# Patient Record
Sex: Female | Born: 1950 | Race: Black or African American | Hispanic: No | Marital: Single | Smoking: Current every day smoker
Health system: Southern US, Community
[De-identification: ages and names within clinical notes are randomized; demographics above are authoritative.]

## PROBLEM LIST (undated history)

## (undated) DIAGNOSIS — F329 Major depressive disorder, single episode, unspecified: Secondary | ICD-10-CM

## (undated) DIAGNOSIS — Z720 Tobacco use: Secondary | ICD-10-CM

## (undated) DIAGNOSIS — I2510 Atherosclerotic heart disease of native coronary artery without angina pectoris: Secondary | ICD-10-CM

## (undated) DIAGNOSIS — I259 Chronic ischemic heart disease, unspecified: Secondary | ICD-10-CM

## (undated) DIAGNOSIS — E785 Hyperlipidemia, unspecified: Secondary | ICD-10-CM

## (undated) DIAGNOSIS — Q828 Other specified congenital malformations of skin: Secondary | ICD-10-CM

## (undated) DIAGNOSIS — I10 Essential (primary) hypertension: Secondary | ICD-10-CM

## (undated) DIAGNOSIS — L209 Atopic dermatitis, unspecified: Secondary | ICD-10-CM

## (undated) DIAGNOSIS — F32A Depression, unspecified: Secondary | ICD-10-CM

## (undated) DIAGNOSIS — E119 Type 2 diabetes mellitus without complications: Secondary | ICD-10-CM

## (undated) DIAGNOSIS — I63239 Cerebral infarction due to unspecified occlusion or stenosis of unspecified carotid arteries: Secondary | ICD-10-CM

## (undated) DIAGNOSIS — G40909 Epilepsy, unspecified, not intractable, without status epilepticus: Secondary | ICD-10-CM

## (undated) DIAGNOSIS — I639 Cerebral infarction, unspecified: Secondary | ICD-10-CM

## (undated) HISTORY — PX: ABDOMINAL HYSTERECTOMY: SHX81

---
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
## 2018-04-24 ENCOUNTER — Other Ambulatory Visit: Payer: Self-pay | Admitting: Physician Assistant

## 2018-04-24 DIAGNOSIS — Z780 Asymptomatic menopausal state: Secondary | ICD-10-CM

## 2018-04-27 ENCOUNTER — Other Ambulatory Visit: Payer: Self-pay | Admitting: Internal Medicine

## 2018-10-10 ENCOUNTER — Ambulatory Visit
Admission: EM | Admit: 2018-10-10 | Discharge: 2018-10-10 | Disposition: A | Discharge status: Home / Self Care | Payer: Medicare HMO | Attending: Family Medicine | Admitting: Family Medicine

## 2018-10-10 ENCOUNTER — Encounter: Payer: Self-pay | Admitting: Gynecology

## 2018-10-10 ENCOUNTER — Other Ambulatory Visit: Payer: Self-pay

## 2018-10-10 DIAGNOSIS — L0291 Cutaneous abscess, unspecified: Secondary | ICD-10-CM | POA: Diagnosis not present

## 2018-10-10 HISTORY — DX: Atopic dermatitis, unspecified: L20.9

## 2018-10-10 HISTORY — DX: Other specified congenital malformations of skin: Q82.8

## 2018-10-10 HISTORY — DX: Cerebral infarction, unspecified: I63.9

## 2018-10-10 HISTORY — DX: Epilepsy, unspecified, not intractable, without status epilepticus: G40.909

## 2018-10-10 HISTORY — DX: Tobacco use: Z72.0

## 2018-10-10 HISTORY — DX: Atherosclerotic heart disease of native coronary artery without angina pectoris: I25.10

## 2018-10-10 HISTORY — DX: Depression, unspecified: F32.A

## 2018-10-10 HISTORY — DX: Hyperlipidemia, unspecified: E78.5

## 2018-10-10 HISTORY — DX: Type 2 diabetes mellitus without complications: E11.9

## 2018-10-10 HISTORY — DX: Cerebral infarction due to unspecified occlusion or stenosis of unspecified carotid artery: I63.239

## 2018-10-10 HISTORY — DX: Chronic ischemic heart disease, unspecified: I25.9

## 2018-10-10 HISTORY — DX: Essential (primary) hypertension: I10

## 2018-10-10 MED ORDER — DOXYCYCLINE HYCLATE 100 MG PO CAPS: 100.0000 mg | ORAL_CAPSULE | Freq: Two times a day (BID) | ORAL | 0 refills | Status: AC

## 2018-10-10 NOTE — Discharge Instructions (Addendum)
Take medication as prescribed.  Continue home ophthalmic antibiotic.  Frequent warm compresses.  Keep clean.  Continue to follow-up closely with your primary doctor regarding this.  Also it is important for you to follow-up with ophthalmology as planned.  Please call to follow-up for appointment.  Return to urgent care as needed.  Proceed directly to emergency room for pain moving eye, vision changes or increasing size or pain.

## 2018-10-10 NOTE — ED Triage Notes (Addendum)
Patient with left upper nose abscess close to her left  eye x 4 days ago. Patient stated was seen by her pcp on Wednesday , however abscess getting bigger.

## 2018-10-10 NOTE — ED Provider Notes (Addendum)
[facility] ____________________________________________  Time seen: Approximately 1:01 PM  I have reviewed the triage vital signs and the nursing notes.   HISTORY  Chief Complaint Abscess   HPI Diamond Edwards is a 68 y.o. female past medical history of CAD, MI, hypertension, CVA, spastic hemiparesis, diabetes presenting for evaluation of left abscess.  Reports she was seen by her primary care office 3 days ago for the same complaint.  Reports she has been using the antibiotic eye ointment as prescribed but states in the last 2 days the area has continued to increase in size.  States the area is tender.  Has been actively draining.  Denies vision changes, vision loss, eye pain, foreign body sensation.  States the only vision change she has is some blurriness after using the antibiotic ointment but not prior.  Denies pain with eye movement.  No accompanying fevers.  No recent cough, congestion, sore throat or sickness.  Denies history of similar in the past.  Has intermittently applied some warm compresses.  Denies other aggravating alleviating factors.  Patient reports when she saw her primary care office they put in a referral to [facility].  PCP: [facility]     Past Medical History:  Diagnosis Date  . Atopic dermatitis   . CAD (coronary artery disease)   . Carotid artery occlusion with cerebral infarction ([place])   . Depression   . Diabetes mellitus without complication ([place])   . Epilepsy ([place])   . Hyperlipemia   . Hypertension   . Ischemic heart disease   . Porokeratosis   . Stroke ([place])   . Tobacco abuse disorder     There are no active problems to display for this patient.   History reviewed. No pertinent surgical history.   No current facility-administered medications for this encounter.   Current Outpatient Medications:  .  acetaminophen (TYLENOL) 500 MG tablet, Take by mouth., Disp: , Rfl:  .  aspirin EC 81 MG tablet, Take by mouth., Disp: , Rfl:  .   Blood Glucose Monitoring Suppl (FIFTY50 GLUCOSE METER 2.0) w/Device KIT, Use as directed, Disp: , Rfl:  .  erythromycin ophthalmic ointment, Apply to eye., Disp: , Rfl:  .  glucose blood (PRECISION QID TEST) test strip, Use 1 each (1 strip total) 3 (three) times daily Use as instructed., Disp: , Rfl:  .  Lancets 28G MISC, Use 1 each 3 (three) times daily Use as instructed., Disp: , Rfl:  .  nitroGLYCERIN (NITROSTAT) 0.4 MG SL tablet, Place under the tongue., Disp: , Rfl:  .  omeprazole (PRILOSEC) 20 MG capsule, Take by mouth., Disp: , Rfl:  .  pregabalin (LYRICA) 100 MG capsule, Take by mouth., Disp: , Rfl:  .  tiZANidine (ZANAFLEX) 2 MG tablet, Take by mouth., Disp: , Rfl:  .  [START ON 10/17/2018] amLODipine (NORVASC) 5 MG tablet, Take by mouth., Disp: , Rfl:  .  [START ON 10/17/2018] atorvastatin (LIPITOR) 40 MG tablet, Take by mouth., Disp: , Rfl:  .  doxycycline (VIBRAMYCIN) 100 MG capsule, Take 1 capsule (100 mg total) by mouth 2 (two) times daily., Disp: 20 capsule, Rfl: 0 .  [START ON 10/17/2018] glipiZIDE (GLUCOTROL XL) 10 MG 24 hr tablet, Take by mouth., Disp: , Rfl:  .  [START ON 10/17/2018] lisinopril (ZESTRIL) 10 MG tablet, Take by mouth., Disp: , Rfl:  .  [START ON 10/17/2018] metFORMIN (GLUCOPHAGE) 1000 MG tablet, Take by mouth., Disp: , Rfl:  .  [START ON 10/17/2018] metoprolol tartrate (LOPRESSOR)  25 MG tablet, Take by mouth., Disp: , Rfl:  .  naproxen sodium (ALEVE) 220 MG tablet, Take by mouth., Disp: , Rfl:  .  [START ON 10/17/2018] pioglitazone (ACTOS) 15 MG tablet, Take by mouth., Disp: , Rfl:  .  [START ON 10/17/2018] sertraline (ZOLOFT) 25 MG tablet, Take by mouth., Disp: , Rfl:   Allergies Penicillins and Baclofen  History reviewed. No pertinent family history.  Social History Social History   Tobacco Use  . Smoking status: Current Every Day Smoker    Types: Cigarettes  . Smokeless tobacco: Never Used  Substance Use Topics  . Alcohol use: Yes    Comment: occasion  .  Drug use: Never    Review of Systems Constitutional: No fever Eyes: No visual changes. ENT: No sore throat. Cardiovascular: Denies chest pain. Respiratory: Denies shortness of breath. Gastrointestinal: No abdominal pain.  No nausea, no vomiting.  No diarrhea.   Skin: Positive for rash. Neurological: Negative for numbness.   ____________________________________________   PHYSICAL EXAM:  VITAL SIGNS: ED Triage Vitals  Enc Vitals Group     BP 10/10/18 1227 (!) 152/80     Pulse Rate 10/10/18 1227 76     Resp 10/10/18 1227 16     Temp 10/10/18 1227 98.5 F (36.9 C)     Temp Source 10/10/18 1227 Oral     SpO2 10/10/18 1227 100 %     Weight 10/10/18 1212 142 lb (64.4 kg)     Height 10/10/18 1212 '5\' 2"'$  (1.575 m)     Head Circumference --      Peak Flow --      Pain Score 10/10/18 1212 7     Pain Loc --      Pain Edu? --      Excl. in GC? --      Visual Acuity  Right Eye Distance: 20/20 Left Eye Distance: 20/25 Bilateral Distance: 20/20(with corrective lens)   Constitutional: Alert and oriented. Well appearing and in no acute distress. Eyes: Conjunctivae are normal. PERRL. EOMI. no pain with EOMs.  Skin changes as below.  Eyes nontender to direct palpation.  No further surrounding erythema. ENT      Head: Normocephalic and atraumatic.      Nose: No congestion. Nontender. Cardiovascular: Normal rate, regular rhythm. Grossly normal heart sounds.  Good peripheral circulation. Respiratory: Normal respiratory effort without tachypnea nor retractions. Breath sounds are clear and equal bilaterally. No wheezes, rales, rhonchi. Musculoskeletal:  Steady gait.  Neurologic:  Normal speech and language. Speech is normal. No gait instability.  Skin:  Skin is warm, dry.  Except:  Abscess is depicted below, with erythema, tenderness and drainage directly to abscess site, no other drainage noted, no bony tenderness, no pain with EOMs.    Psychiatric: Mood and affect are normal.  Speech and behavior are normal. Patient exhibits appropriate insight and judgment   ___________________________________________   LABS (all labs ordered are listed, but only abnormal results are displayed)  Labs Reviewed  AEROBIC CULTURE (SUPERFICIAL SPECIMEN)    PROCEDURES Procedures    INITIAL IMPRESSION / ASSESSMENT AND PLAN / ED COURSE  Pertinent labs & imaging results that were available during my care of the patient were reviewed by me and considered in my medical decision making (see chart for details).  Well-appearing patient.  Abscess as depicted above.  Wound is actively draining.  No pain with EOMs.  Visual acuity intact.  Patient has been using erythromycin ophthalmic ointment.  Will also place on  doxycycline and encourage continue ointment.  Frequent warm compresses, keeping clean and follow-up with ophthalmology this week.  Patient has already had referral placed ophthalmology.  Discussed very strict follow-up and return parameters.  Proceed directly to emergency room for increased swelling, pain with eye movement, vision changes or worsening complaints.Discussed indication, risks and benefits of medications with patient. Wound culture obtained.   Discussed follow up with Primary care physician this week. Discussed follow up and return parameters including no resolution or any worsening concerns. Patient verbalized understanding and agreed to plan.   ____________________________________________   FINAL CLINICAL IMPRESSION(S) / ED DIAGNOSES  Final diagnoses:  Abscess     ED Discharge Orders         Ordered    doxycycline (VIBRAMYCIN) 100 MG capsule  2 times daily     10/10/18 1303           Note: This dictation was prepared with Dragon dictation along with smaller phrase technology. Any transcriptional errors that result from this process are unintentional.        Marylene Land, NP 10/10/18 1410

## 2018-10-13 LAB — AEROBIC CULTURE W GRAM STAIN (SUPERFICIAL SPECIMEN)

## 2018-10-14 ENCOUNTER — Telehealth (HOSPITAL_COMMUNITY): Payer: Self-pay | Admitting: Emergency Medicine

## 2018-10-14 NOTE — Telephone Encounter (Signed)
called patient to see how she was feeling, pt states she is feeling better, abscess is getting better. Pt will follow up with ENT. Dr. Lanny Cramp reviewed chart, no change in treatment.

## 2018-11-20 ENCOUNTER — Other Ambulatory Visit: Payer: Self-pay | Admitting: Physician Assistant

## 2018-11-20 DIAGNOSIS — Z1231 Encounter for screening mammogram for malignant neoplasm of breast: Secondary | ICD-10-CM

## 2019-02-09 ENCOUNTER — Other Ambulatory Visit: Payer: Medicare HMO

## 2019-02-09 ENCOUNTER — Inpatient Hospital Stay: Admission: RE | Admit: 2019-02-09 | Payer: Medicare HMO | Source: Ambulatory Visit

## 2019-02-18 ENCOUNTER — Other Ambulatory Visit: Payer: Self-pay

## 2019-02-18 ENCOUNTER — Encounter (INDEPENDENT_AMBULATORY_CARE_PROVIDER_SITE_OTHER): Payer: Self-pay

## 2019-02-18 ENCOUNTER — Ambulatory Visit
Admission: RE | Admit: 2019-02-18 | Discharge: 2019-02-18 | Disposition: A | Discharge status: Home / Self Care | Payer: Medicare HMO | Source: Ambulatory Visit | Attending: Physician Assistant | Admitting: Physician Assistant

## 2019-02-18 DIAGNOSIS — Z780 Asymptomatic menopausal state: Secondary | ICD-10-CM | POA: Insufficient documentation

## 2019-02-18 DIAGNOSIS — Z1231 Encounter for screening mammogram for malignant neoplasm of breast: Secondary | ICD-10-CM | POA: Diagnosis present

## 2019-02-22 ENCOUNTER — Other Ambulatory Visit: Payer: Self-pay | Admitting: *Deleted

## 2019-02-22 ENCOUNTER — Inpatient Hospital Stay
Admission: RE | Admit: 2019-02-22 | Discharge: 2019-02-22 | Disposition: A | Discharge status: Home / Self Care | Payer: Self-pay | Source: Ambulatory Visit | Attending: *Deleted | Admitting: *Deleted

## 2019-02-22 DIAGNOSIS — Z1231 Encounter for screening mammogram for malignant neoplasm of breast: Secondary | ICD-10-CM

## 2019-02-24 ENCOUNTER — Other Ambulatory Visit: Payer: Self-pay | Admitting: Physician Assistant

## 2019-02-24 DIAGNOSIS — R928 Other abnormal and inconclusive findings on diagnostic imaging of breast: Secondary | ICD-10-CM

## 2019-02-24 DIAGNOSIS — N6320 Unspecified lump in the left breast, unspecified quadrant: Secondary | ICD-10-CM

## 2019-02-26 ENCOUNTER — Other Ambulatory Visit: Payer: Medicare HMO

## 2019-02-26 ENCOUNTER — Ambulatory Visit: Payer: Medicare HMO

## 2019-03-02 ENCOUNTER — Ambulatory Visit
Admission: RE | Admit: 2019-03-02 | Discharge: 2019-03-02 | Disposition: A | Discharge status: Home / Self Care | Payer: Medicare HMO | Source: Ambulatory Visit | Attending: Physician Assistant | Admitting: Physician Assistant

## 2019-03-02 DIAGNOSIS — N6320 Unspecified lump in the left breast, unspecified quadrant: Secondary | ICD-10-CM | POA: Insufficient documentation

## 2019-03-02 DIAGNOSIS — R928 Other abnormal and inconclusive findings on diagnostic imaging of breast: Secondary | ICD-10-CM | POA: Insufficient documentation

## 2019-03-03 ENCOUNTER — Other Ambulatory Visit: Payer: Self-pay | Admitting: Physician Assistant

## 2019-03-03 DIAGNOSIS — R140 Abdominal distension (gaseous): Secondary | ICD-10-CM

## 2019-03-03 DIAGNOSIS — R599 Enlarged lymph nodes, unspecified: Secondary | ICD-10-CM

## 2019-03-03 DIAGNOSIS — R928 Other abnormal and inconclusive findings on diagnostic imaging of breast: Secondary | ICD-10-CM

## 2019-03-04 ENCOUNTER — Ambulatory Visit: Payer: Medicare HMO

## 2019-03-08 ENCOUNTER — Ambulatory Visit
Admission: RE | Admit: 2019-03-08 | Discharge: 2019-03-08 | Disposition: A | Discharge status: Home / Self Care | Payer: Medicare HMO | Source: Ambulatory Visit | Attending: Physician Assistant | Admitting: Physician Assistant

## 2019-03-08 ENCOUNTER — Other Ambulatory Visit: Payer: Self-pay

## 2019-03-08 DIAGNOSIS — I700 Atherosclerosis of aorta: Secondary | ICD-10-CM | POA: Diagnosis not present

## 2019-03-08 DIAGNOSIS — K760 Fatty (change of) liver, not elsewhere classified: Secondary | ICD-10-CM | POA: Diagnosis not present

## 2019-03-08 DIAGNOSIS — R140 Abdominal distension (gaseous): Secondary | ICD-10-CM | POA: Insufficient documentation

## 2019-03-08 DIAGNOSIS — K439 Ventral hernia without obstruction or gangrene: Secondary | ICD-10-CM | POA: Diagnosis not present

## 2019-03-08 MED ORDER — IOHEXOL 300 MG/ML  SOLN
100.0000 mL | Freq: Once | INTRAMUSCULAR | Status: AC | PRN
  Administered 2019-03-08: 11:00:00 100 mL via INTRAVENOUS

## 2019-06-01 ENCOUNTER — Other Ambulatory Visit: Payer: Medicare HMO

## 2019-06-29 ENCOUNTER — Ambulatory Visit
Admission: RE | Admit: 2019-06-29 | Discharge: 2019-06-29 | Disposition: A | Discharge status: Home / Self Care | Payer: Medicare HMO | Source: Ambulatory Visit | Attending: Physician Assistant | Admitting: Physician Assistant

## 2019-06-29 DIAGNOSIS — R599 Enlarged lymph nodes, unspecified: Secondary | ICD-10-CM | POA: Insufficient documentation

## 2019-06-29 DIAGNOSIS — R928 Other abnormal and inconclusive findings on diagnostic imaging of breast: Secondary | ICD-10-CM | POA: Insufficient documentation

## 2019-07-19 ENCOUNTER — Other Ambulatory Visit: Admission: RE | Admit: 2019-07-19 | Payer: Medicare HMO | Source: Ambulatory Visit

## 2019-07-21 ENCOUNTER — Encounter: Admission: RE | Payer: Self-pay | Source: Home / Self Care

## 2019-07-21 ENCOUNTER — Ambulatory Visit: Admission: RE | Admit: 2019-07-21 | Payer: Medicare HMO | Source: Home / Self Care | Admitting: General Surgery

## 2019-07-21 SURGERY — COLONOSCOPY WITH PROPOFOL
Anesthesia: General

## 2019-09-05 DEATH — deceased

## 2022-01-07 IMAGING — MG MM DIGITAL DIAGNOSTIC UNILAT*L* W/ TOMO W/ CAD
4 series · 4 of 12 positions shown · non-contrast
Comparison: Previous exam(s).

CLINICAL DATA: Possible mass in the retronipple region of the left
breast on a recent screening mammogram. When having her left breast
ultrasound in today, the patient also described an enlarging mass in
the left axilla for the past 4 months.

EXAM:
DIGITAL DIAGNOSTIC LEFT MAMMOGRAM WITH TOMO
ULTRASOUND LEFT BREAST

[L CC synth-2D]
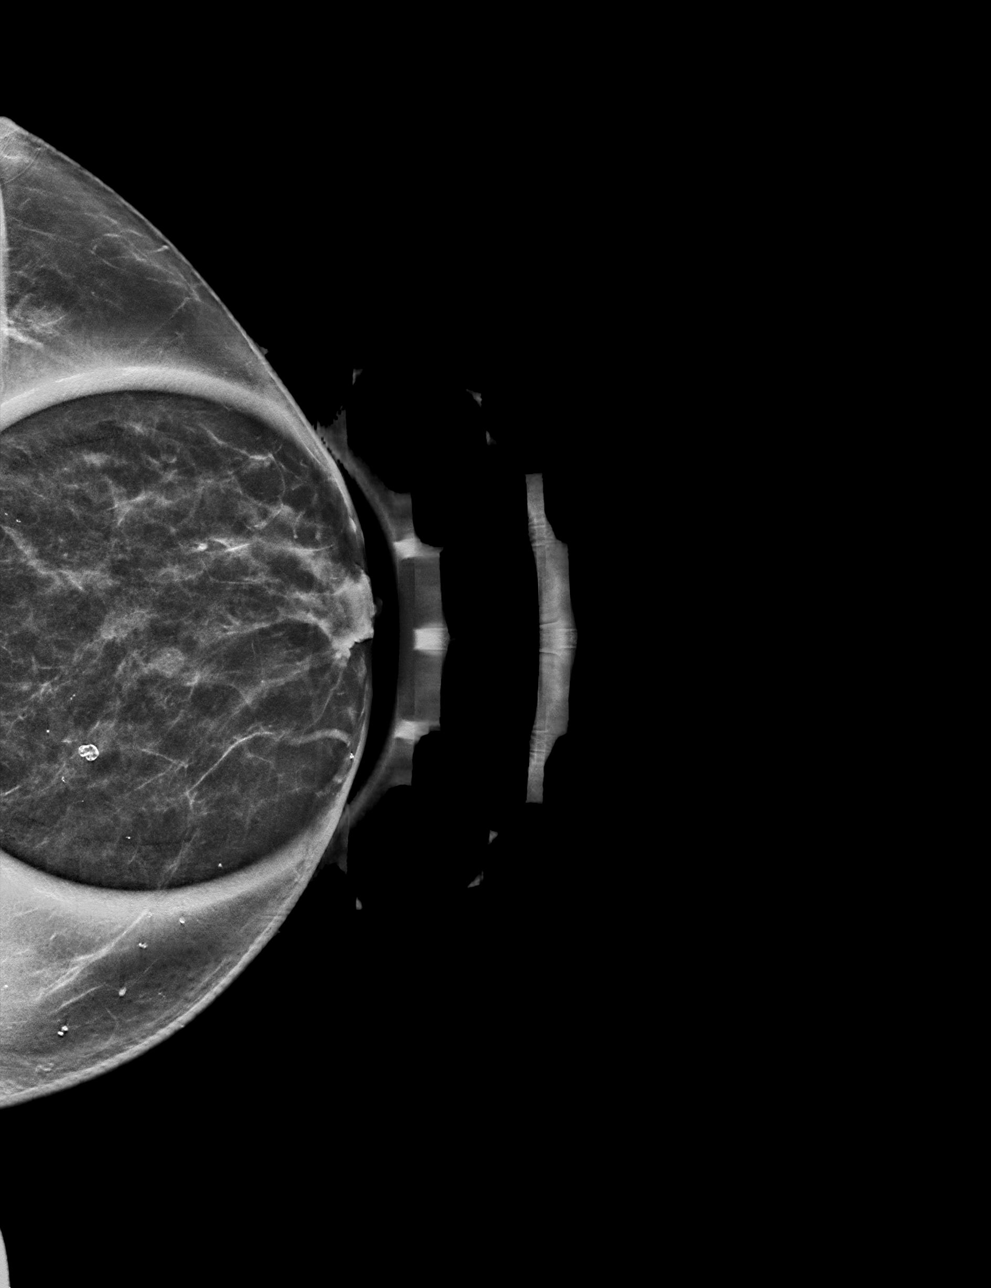

[L MLO synth-2D]
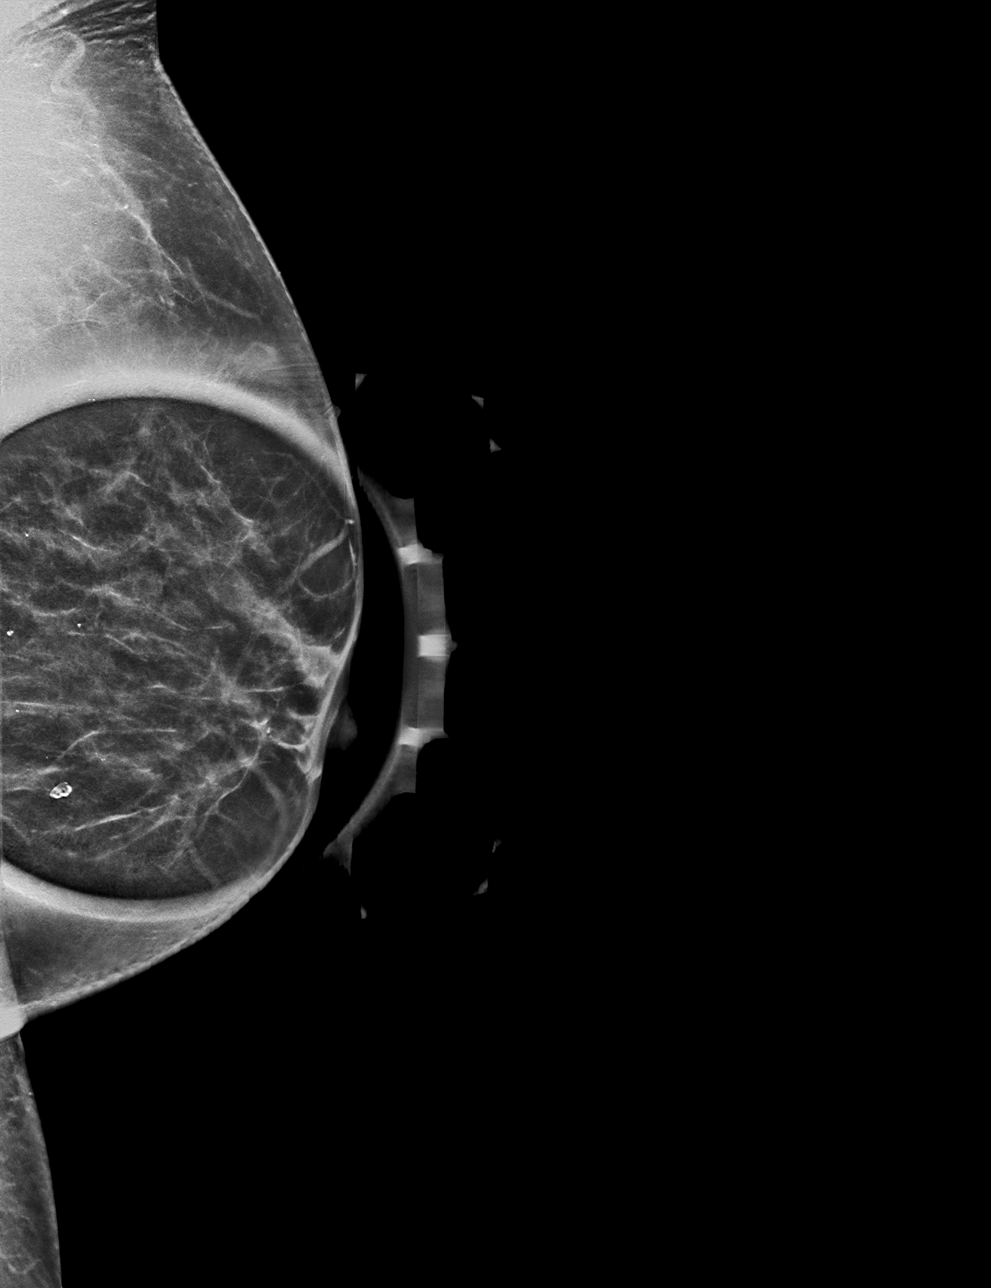

[L CC tomo · tomo slice 27/54.0]
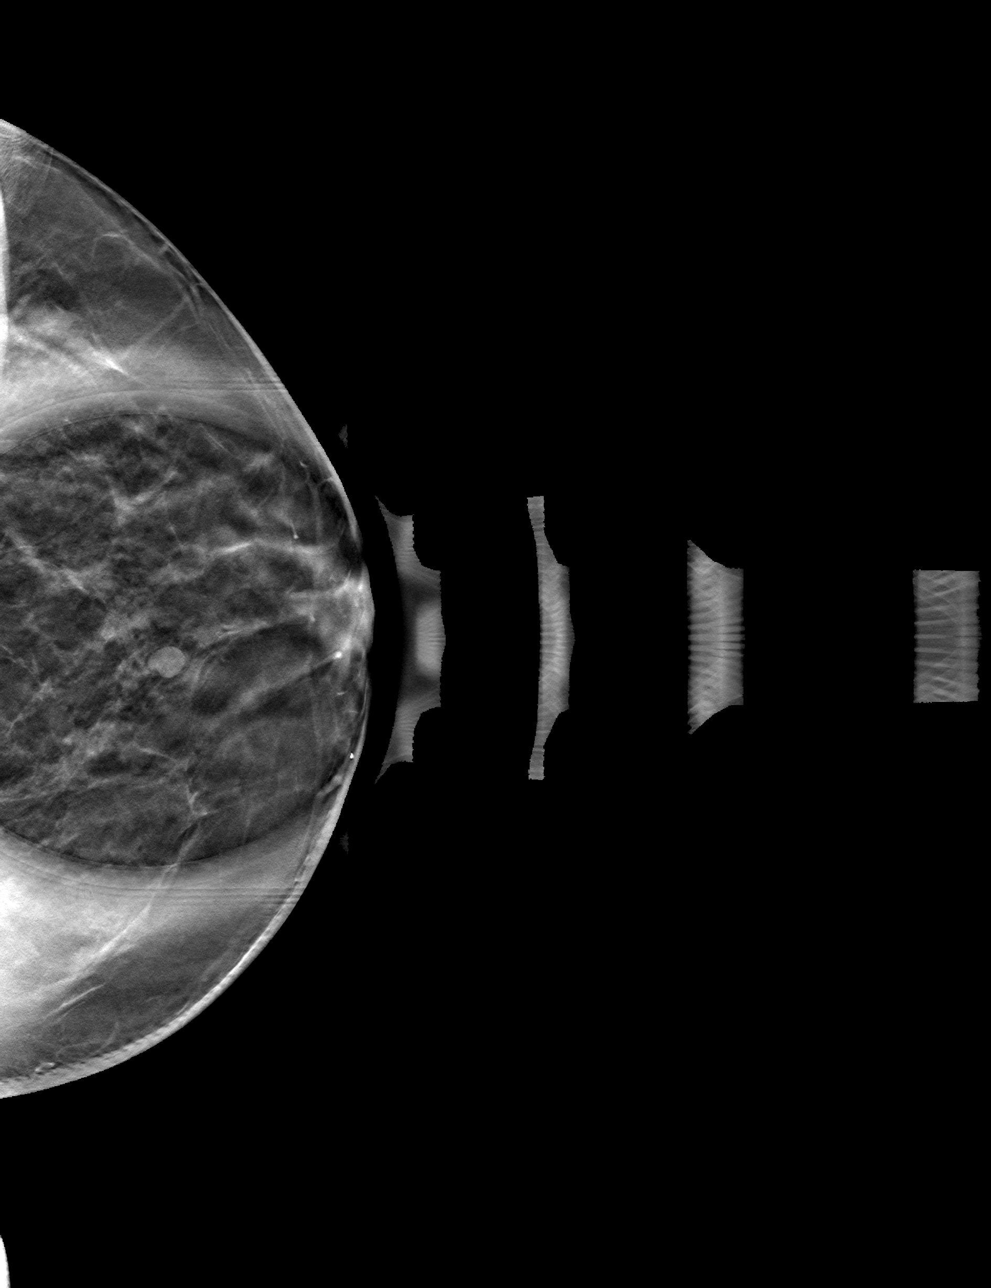

[L MLO tomo · tomo slice 29/57.0]
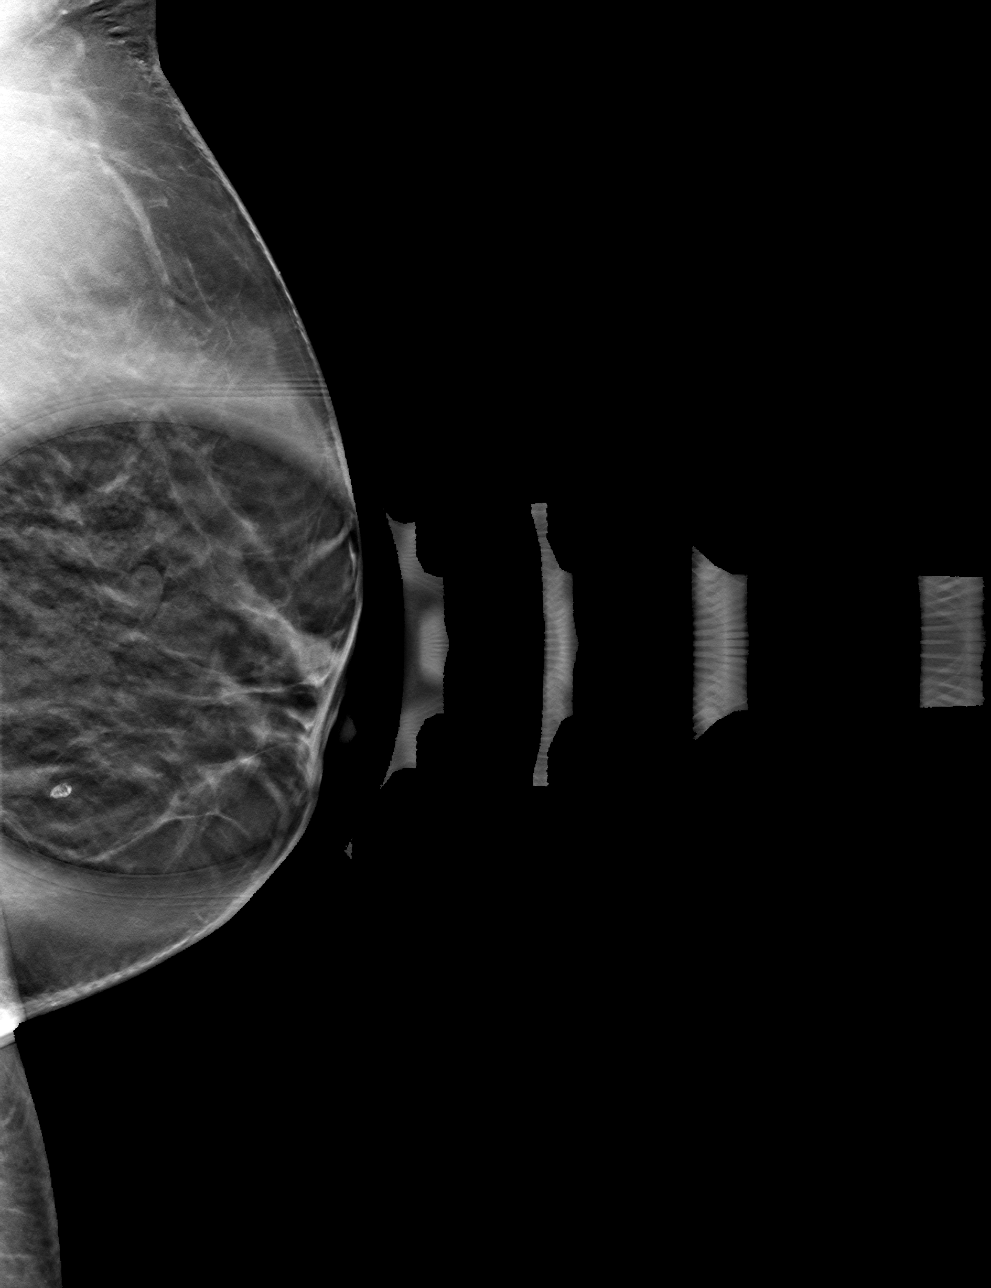

[4 of 12 positions shown; findings below may reference images not displayed]

ACR Breast Density Category c: The breast tissue is heterogeneously
dense, which may obscure small masses.
FINDINGS: 3D tomographic and 2D generated spot compression views of the left
breast confirm a 6-7 mm oval, circumscribed mass in the 12 o'clock
retroareolar left breast, middle depth.

On physical exam, the patient has an approximately 1.5 cm oval,
circumscribed, protruding mass in the left axilla. There is a
suggestion of a smaller overlying skin punctum.

Targeted ultrasound is performed, showing a 1.1 cm cyst containing a
minimally thickened internal septation in the 12 o'clock position of
the left breast, 2 cm from the nipple. No internal blood flow was
seen with power Doppler. This corresponds to the mammographic mass.

Ultrasound of the left axilla demonstrated a 1.5 x 1.5 x 0.9 cm
oval, horizontally oriented, circumscribed, medium echotexture mass
within the posterior aspect of the skin and extending into the
underlying subcutaneous fat. This is mildly heterogeneous and
exhibits increased through transmission of sound. No internal blood
flow was seen with power Doppler. There is a suggestion of a small
tract extending to the overlying skin.

There was a single left axillary lymph node with mild cortical
thickening measuring up to 4.3 mm. A 2nd left axillary lymph node
has a normal appearance. No other lymph nodes were visualized.
IMPRESSION: 1. The recently suspected left breast mass is a benign cyst.
2. 1.5 cm probable sebaceous/epidermal inclusion cyst in the left
axilla.
3. Single left axillary lymph node mild cortical thickening. This is
most likely a reactive node.

RECOMMENDATION:
1. Follow-up left axillary ultrasound in 3 months to reassess the
left axillary lymph node with mild cortical thickening.
2. Clinical follow-up for the left axillary probable sebaceous cyst.

I have discussed the findings and recommendations with the patient.
If applicable, a reminder letter will be sent to the patient
regarding the next appointment.

BI-RADS CATEGORY  3: Probably benign.

## 2022-05-06 IMAGING — US US BREAST*L* LIMITED INC AXILLA
2 series · 11 of 11 positions shown · non-contrast
Comparison: Previous exam(s).

CLINICAL DATA: 68-year-old patient presents for follow-up a
probably benign left axillary lymph node. This was imaged at the
time we evaluated the patient's left axillary mass and a possible
mass identified in the left breast on a screening mammogram from
February 2019. The palpable left axillary breast mass was a probable
sebaceous or epidermal inclusion cyst. The patient states that it is
not currently tender to her.

EXAM:
ULTRASOUND OF THE LEFT BREAST

[Series 1: us breast*left* limited inc axilla · 0.05mm/px · 10 of 10 slices shown (1 of 2)]
[im 1/10]
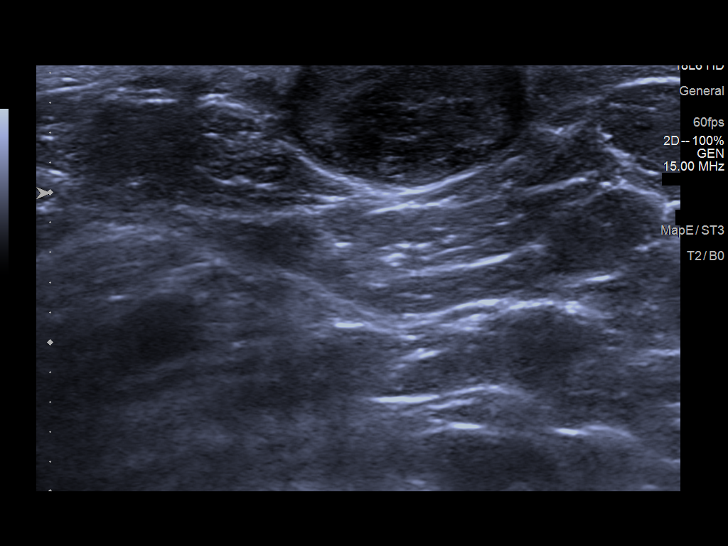
[im 2/10]
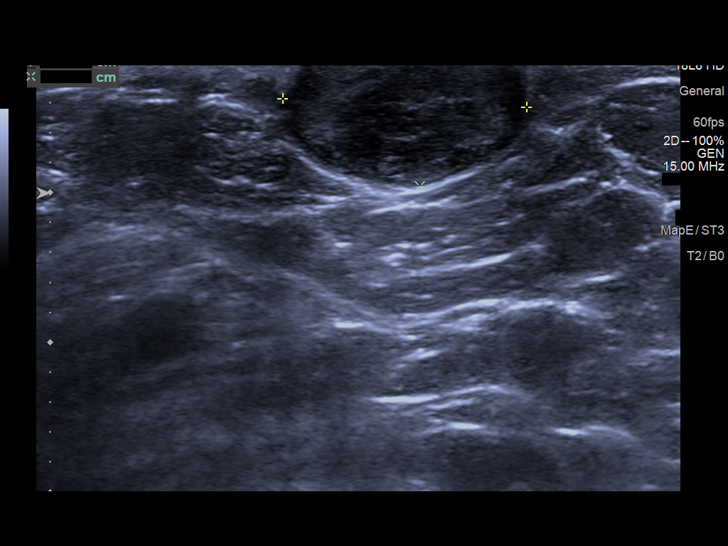
[im 3/10]
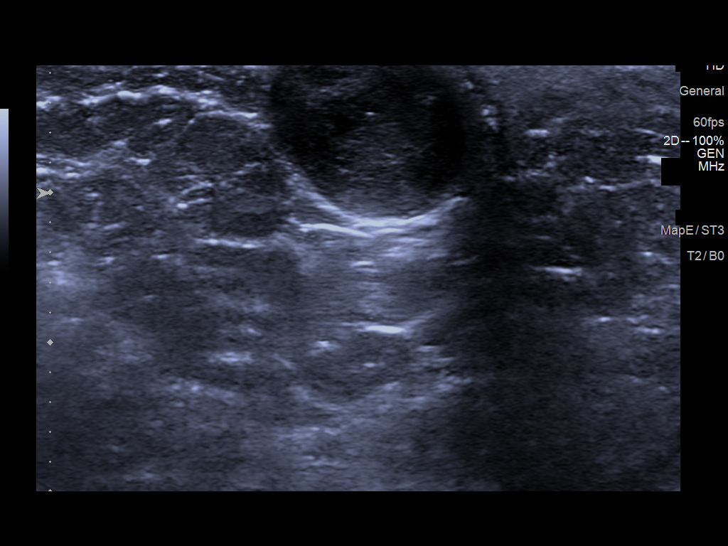
[im 4/10]
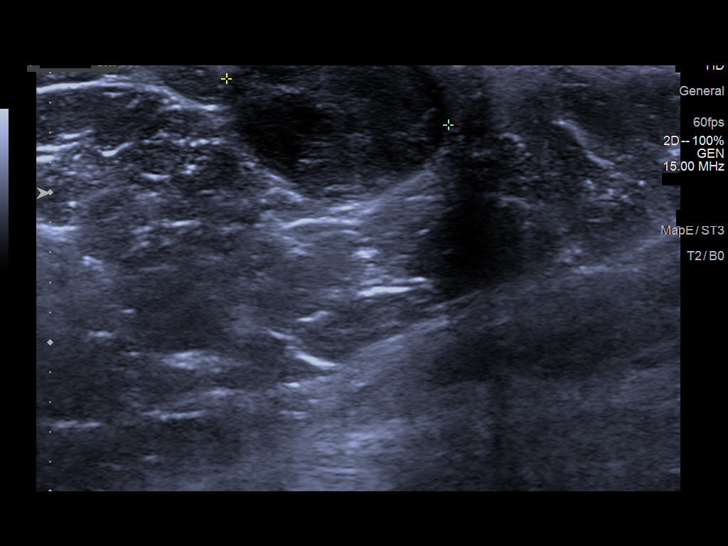
[im 5/10]
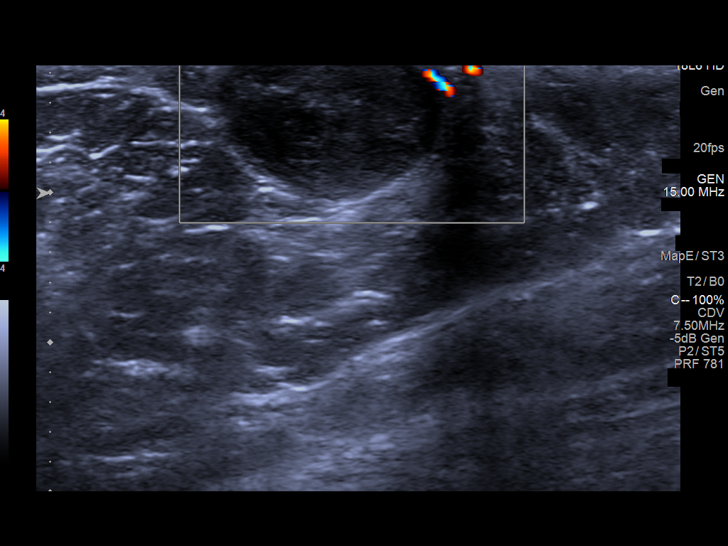
[im 6/10]
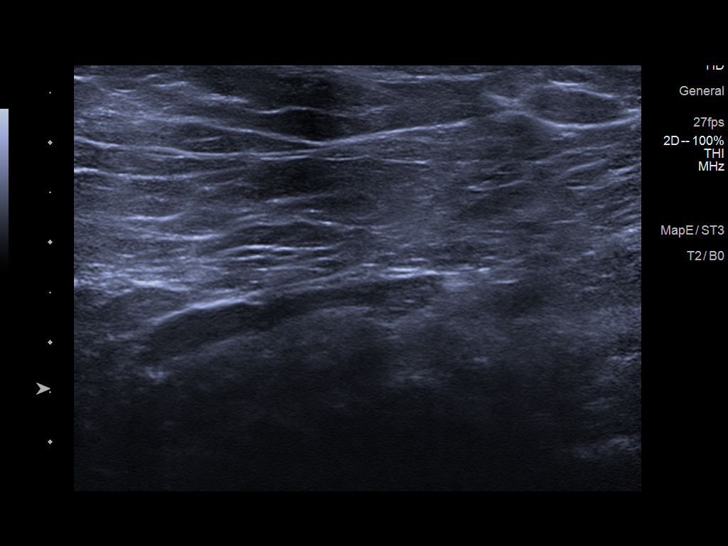
[im 7/10]
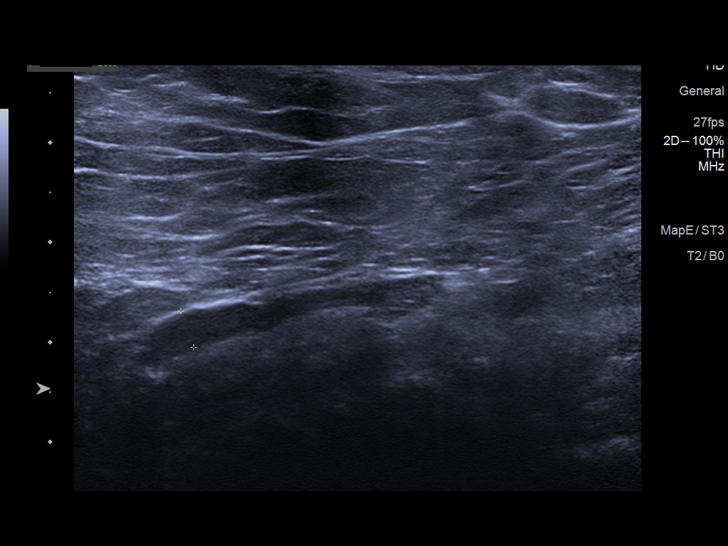
[im 8/10]
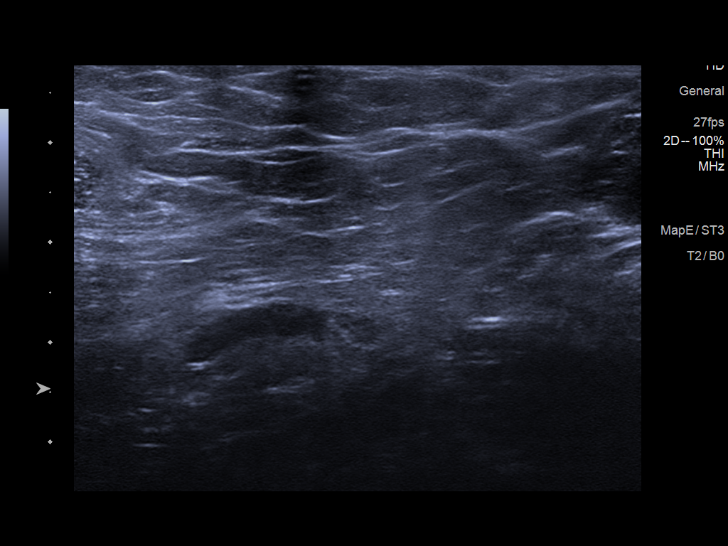
[im 9/10]
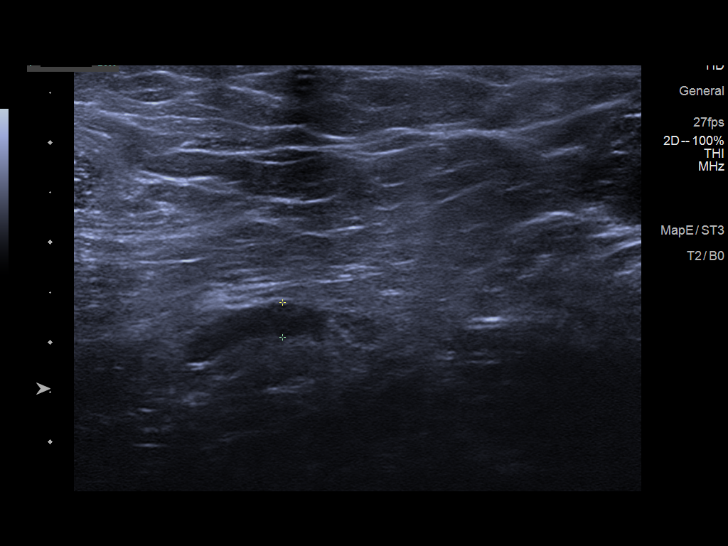
[im 10/10]
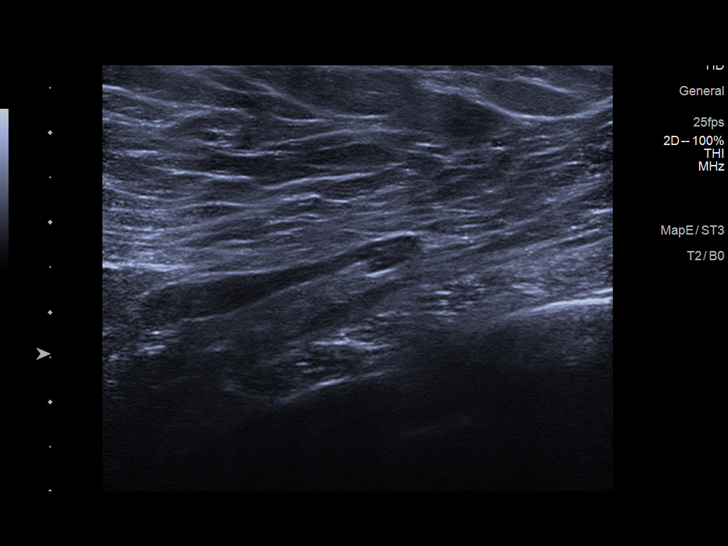

[Series 2: us breast*left* limited inc axilla · 0.07mm/px · 1 of 1 slices shown (2 of 2)]
[im 1/1]
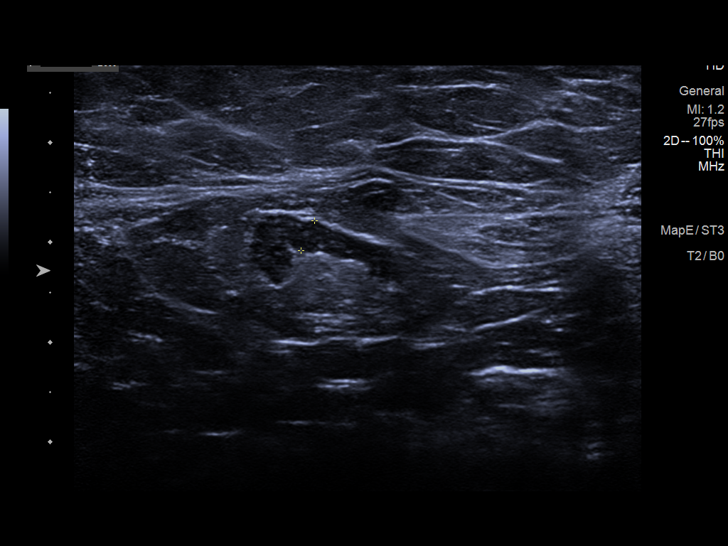

[11 of 11 positions shown; findings below may reference images not displayed]

FINDINGS: On physical exam, there is a visible and palpable smooth 1.5 cm oval
mass in the left axilla with no associated erythema or tenderness.

Targeted ultrasound is performed, showing an oval hypoechoic
circumscribed mass likely associated with the dermis and extending
into the subcutaneous fat of the left axilla. Mass measures 1.6 x
0.9 x 1.5 cm. No internal vascular flow or significant peripheral
flow. No overlying skin thickening. Findings are similar to prior
ultrasound February 2019 and consistent with benign epidermal
inclusion cyst or sebaceous cyst.

Review of the left axillary lymph nodes shows a normal elongate
lymph node with well preserved fatty hilum and cortical thickness
measuring up to 3.5 mm. Previously on ultrasound February 2019
cortical thickness was measured to be up to 4.3 mm.

For comparison purposes, the right axillary lymph nodes were
evaluated and appear similar to the left, with cortical thickening
of approximately 3.3 mm and normal fatty hilum.
IMPRESSION: Left axillary lymph node cortical thickness has decreased to 3.9 mm
and has a similar appearance to right axillary lymph node. Normal
preserved fatty hilum. Lymph nodes are thought to be benign.

Benign epidermal inclusion cyst or sebaceous cyst left axilla.

RECOMMENDATION:
If the cutaneous left axillary mass is bothersome to the patient,
then dermatology or surgical consult could be considered for removal
of this palpable sebaceous cyst or epidermal inclusion cyst.

Bilateral screening mammogram recommended in February 2020.

I have discussed the findings and recommendations with the patient.
If applicable, a reminder letter will be sent to the patient
regarding the next appointment.

BI-RADS CATEGORY  2: Benign.
